# Patient Record
Sex: Female | Born: 1995 | Race: White | Hispanic: No | Marital: Single | Smoking: Never smoker
Health system: Southern US, Community
[De-identification: ages and names within clinical notes are randomized; demographics above are authoritative.]

## PROBLEM LIST (undated history)

## (undated) DIAGNOSIS — F329 Major depressive disorder, single episode, unspecified: Secondary | ICD-10-CM

## (undated) DIAGNOSIS — Z8742 Personal history of other diseases of the female genital tract: Secondary | ICD-10-CM

## (undated) DIAGNOSIS — N946 Dysmenorrhea, unspecified: Secondary | ICD-10-CM

## (undated) DIAGNOSIS — N83209 Unspecified ovarian cyst, unspecified side: Secondary | ICD-10-CM

## (undated) DIAGNOSIS — F32A Depression, unspecified: Secondary | ICD-10-CM

## (undated) HISTORY — DX: Depression, unspecified: F32.A

## (undated) HISTORY — DX: Dysmenorrhea, unspecified: N94.6

## (undated) HISTORY — DX: Unspecified ovarian cyst, unspecified side: N83.209

## (undated) HISTORY — DX: Personal history of other diseases of the female genital tract: Z87.42

## (undated) HISTORY — DX: Major depressive disorder, single episode, unspecified: F32.9

---

## 2016-10-24 DIAGNOSIS — N83209 Unspecified ovarian cyst, unspecified side: Secondary | ICD-10-CM

## 2016-10-24 HISTORY — DX: Unspecified ovarian cyst, unspecified side: N83.209

## 2016-11-02 ENCOUNTER — Ambulatory Visit (HOSPITAL_COMMUNITY)
Admission: EM | Admit: 2016-11-02 | Discharge: 2016-11-02 | Disposition: A | Discharge status: Home / Self Care | Payer: Commercial Managed Care - PPO | Attending: Family Medicine | Admitting: Family Medicine

## 2016-11-02 ENCOUNTER — Ambulatory Visit (HOSPITAL_COMMUNITY): Payer: Commercial Managed Care - PPO

## 2016-11-02 ENCOUNTER — Encounter (HOSPITAL_COMMUNITY): Payer: Self-pay | Admitting: Emergency Medicine

## 2016-11-02 DIAGNOSIS — B9789 Other viral agents as the cause of diseases classified elsewhere: Secondary | ICD-10-CM | POA: Diagnosis not present

## 2016-11-02 DIAGNOSIS — J069 Acute upper respiratory infection, unspecified: Secondary | ICD-10-CM | POA: Diagnosis not present

## 2016-11-02 MED ORDER — AZITHROMYCIN 250 MG PO TABS: ORAL_TABLET | ORAL | 0 refills | Status: DC

## 2016-11-02 MED ORDER — HYDROCOD POLST-CPM POLST ER 10-8 MG/5ML PO SUER: 5.0000 mL | Freq: Two times a day (BID) | ORAL | 1 refills | Status: DC | PRN

## 2016-11-02 MED ORDER — IPRATROPIUM BROMIDE 0.06 % NA SOLN: 2.0000 | Freq: Four times a day (QID) | NASAL | 1 refills | Status: DC

## 2016-11-02 NOTE — Discharge Instructions (Signed)
Drink plenty of fluids as discussed, use medicine as prescribed, and mucinex or delsym for cough. Return or see your doctor if further problems °

## 2016-11-02 NOTE — ED Provider Notes (Signed)
[facility]    CSN: [id number] Arrival date & time: 11/02/16  1818     History   Chief Complaint Chief Complaint  Patient presents with  . Cough    HPI Kelli Willis is a 21 y.o. female.   The history is provided by the patient.  Cough  Cough characteristics:  Non-productive Severity:  Moderate Onset quality:  Gradual Duration:  4 weeks Progression:  Unchanged Chronicity:  New Smoker: no   Context: upper respiratory infection and weather changes   Relieved by:  None tried Worsened by:  Nothing Ineffective treatments:  None tried Associated symptoms: rhinorrhea   Associated symptoms: no fever, no shortness of breath, no sore throat and no wheezing     History reviewed. No pertinent past medical history.  There are no active problems to display for this patient.   History reviewed. No pertinent surgical history.  OB History    No data available       Home Medications    Prior to Admission medications   Medication Sig Start Date End Date Taking? Authorizing Provider  benzonatate (TESSALON) 100 MG capsule Take by mouth 3 (three) times daily as needed for cough.   Yes Historical Provider, MD  sertraline (ZOLOFT) 100 MG tablet Take 100 mg by mouth daily.   Yes Historical Provider, MD    Family History History reviewed. No pertinent family history.  Social History Social History  Substance Use Topics  . Smoking status: Never Smoker  . Smokeless tobacco: Never Used  . Alcohol use Yes     Comment: Occ     Allergies   Patient has no known allergies.   Review of Systems Review of Systems  Constitutional: Negative.  Negative for fever.  HENT: Positive for congestion, postnasal drip and rhinorrhea. Negative for sore throat.   Respiratory: Positive for cough. Negative for shortness of breath, wheezing and stridor.   Cardiovascular: Negative.   All other systems reviewed and are negative.    Physical Exam Triage Vital Signs ED  Triage Vitals  Enc Vitals Group     BP 11/02/16 1911 109/80     Pulse Rate 11/02/16 1911 82     Resp 11/02/16 1911 16     Temp 11/02/16 1911 98.4 F (36.9 C)     Temp Source 11/02/16 1911 Oral     SpO2 11/02/16 1911 100 %     Weight --      Height --      Head Circumference --      Peak Flow --      Pain Score 11/02/16 1910 4     Pain Loc --      Pain Edu? --      Excl. in GC? --    No data found.   Updated Vital Signs BP 109/80 (BP Location: Right Arm)   Pulse 82   Temp 98.4 F (36.9 C) (Oral)   Resp 16   LMP  (Exact Date)   SpO2 100%   Visual Acuity Right Eye Distance:   Left Eye Distance:   Bilateral Distance:    Right Eye Near:   Left Eye Near:    Bilateral Near:     Physical Exam  Constitutional: She appears well-developed and well-nourished. No distress.  HENT:  Right Ear: External ear normal.  Left Ear: External ear normal.  Mouth/Throat: Oropharyngeal exudate present.  Eyes: Pupils are equal, round, and reactive to light.  Neck: Normal range of motion. Neck supple.  Cardiovascular: Normal rate and regular rhythm.   Pulmonary/Chest: Effort normal and breath sounds normal.  Skin: Skin is warm and dry.  Nursing note and vitals reviewed.    UC Treatments / Results  Labs (all labs ordered are listed, but only abnormal results are displayed) Labs Reviewed - No data to display  EKG  EKG Interpretation None       Radiology No results found.  Procedures Procedures (including critical care time)  Medications Ordered in UC Medications - No data to display   Initial Impression / Assessment and Plan / UC Course  I have reviewed the triage vital signs and the nursing notes.  Pertinent labs & imaging results that were available during my care of the patient were reviewed by me and considered in my medical decision making (see chart for details).  Clinical Course       Final Clinical Impressions(s) / UC Diagnoses   Final diagnoses:    None    New Prescriptions New Prescriptions   No medications on file     Linna HoffJames D Divonte Senger, MD 11/02/16 2010

## 2016-11-02 NOTE — ED Triage Notes (Signed)
[facility] a [facility]. The patient stated that she was diagnosed with strep and flu-B over christmas. The patient reported that she has been given tessalon pearls and they are not helping with the cough.

## 2016-11-04 ENCOUNTER — Telehealth (HOSPITAL_COMMUNITY): Payer: Self-pay | Admitting: Emergency Medicine

## 2016-11-04 MED ORDER — PREDNISONE 50 MG PO TABS: ORAL_TABLET | ORAL | 0 refills | Status: DC

## 2016-11-04 NOTE — Telephone Encounter (Signed)
Pt called stating not feeling any better... Wanted to know if we could call in some steroids  Per Dr. Artis FlockKindl... Ok to call in Prednisilone 50 mg 1 tab PO x2 days 1/2 PO x2 days #3 no refills  Per her request... Called into [facility] ([address])  Adv pt to f/u w/PCP if not getting any better... Pt verb understanding.

## 2017-01-25 ENCOUNTER — Emergency Department (HOSPITAL_COMMUNITY)
Admission: EM | Admit: 2017-01-25 | Discharge: 2017-01-26 | Disposition: A | Discharge status: Home / Self Care | Payer: Commercial Managed Care - PPO | Attending: Emergency Medicine | Admitting: Emergency Medicine

## 2017-01-25 ENCOUNTER — Encounter (HOSPITAL_COMMUNITY): Payer: Self-pay

## 2017-01-25 DIAGNOSIS — R1032 Left lower quadrant pain: Secondary | ICD-10-CM | POA: Diagnosis present

## 2017-01-25 DIAGNOSIS — R102 Pelvic and perineal pain: Secondary | ICD-10-CM

## 2017-01-25 DIAGNOSIS — N83201 Unspecified ovarian cyst, right side: Secondary | ICD-10-CM | POA: Diagnosis not present

## 2017-01-25 DIAGNOSIS — Z79899 Other long term (current) drug therapy: Secondary | ICD-10-CM | POA: Insufficient documentation

## 2017-01-25 DIAGNOSIS — N83202 Unspecified ovarian cyst, left side: Secondary | ICD-10-CM | POA: Diagnosis not present

## 2017-01-25 DIAGNOSIS — N83209 Unspecified ovarian cyst, unspecified side: Secondary | ICD-10-CM

## 2017-01-25 LAB — COMPREHENSIVE METABOLIC PANEL
ALT: 11 U/L — ABNORMAL LOW (ref 14–54)
ANION GAP: 7 (ref 5–15)
AST: 16 U/L (ref 15–41)
Albumin: 4.5 g/dL (ref 3.5–5.0)
Alkaline Phosphatase: 56 U/L (ref 38–126)
BILIRUBIN TOTAL: 0.5 mg/dL (ref 0.3–1.2)
BUN: 16 mg/dL (ref 6–20)
CO2: 28 mmol/L (ref 22–32)
Calcium: 9.1 mg/dL (ref 8.9–10.3)
Chloride: 102 mmol/L (ref 101–111)
Creatinine, Ser: 0.84 mg/dL (ref 0.44–1.00)
GFR calc Af Amer: >60 mL/min (ref >60)
Glucose, Bld: 85 mg/dL (ref 65–99)
POTASSIUM: 3.5 mmol/L (ref 3.5–5.1)
Sodium: 137 mmol/L (ref 135–145)
TOTAL PROTEIN: 7.3 g/dL (ref 6.5–8.1)

## 2017-01-25 LAB — URINALYSIS, ROUTINE W REFLEX MICROSCOPIC
BACTERIA UA: NONE SEEN
BILIRUBIN URINE: NEGATIVE
Glucose, UA: NEGATIVE mg/dL
HGB URINE DIPSTICK: NEGATIVE
Ketones, ur: NEGATIVE mg/dL
NITRITE: NEGATIVE
Protein, ur: NEGATIVE mg/dL
SPECIFIC GRAVITY, URINE: 1.021 (ref 1.005–1.030)
pH: 5 (ref 5.0–8.0)

## 2017-01-25 LAB — LIPASE, BLOOD: Lipase: 19 U/L (ref 11–51)

## 2017-01-25 LAB — CBC
HEMATOCRIT: 32.8 % — AB (ref 36.0–46.0)
HEMOGLOBIN: 10.8 g/dL — AB (ref 12.0–15.0)
MCH: 30.6 pg (ref 26.0–34.0)
MCHC: 32.9 g/dL (ref 30.0–36.0)
MCV: 92.9 fL (ref 78.0–100.0)
Platelets: 159 10*3/uL (ref 150–400)
RBC: 3.53 MIL/uL — ABNORMAL LOW (ref 3.87–5.11)
RDW: 12.9 % (ref 11.5–15.5)
WBC: 8 10*3/uL (ref 4.0–10.5)

## 2017-01-25 LAB — I-STAT BETA HCG BLOOD, ED (MC, WL, AP ONLY)

## 2017-01-25 MED ORDER — MORPHINE SULFATE (PF) 2 MG/ML IV SOLN
2.0000 mg | Freq: Once | INTRAVENOUS | Status: AC
  Administered 2017-01-26: 2 mg via INTRAVENOUS
  Filled 2017-01-25: qty 1

## 2017-01-25 MED ORDER — SODIUM CHLORIDE 0.9 % IV BOLUS (SEPSIS)
1000.0000 mL | Freq: Once | INTRAVENOUS | Status: AC
  Administered 2017-01-26: 1000 mL via INTRAVENOUS

## 2017-01-25 NOTE — ED Triage Notes (Signed)
PT C/O LLQ ABDOMINAL PAIN WITH BLOATING SINCE Monday. PT DENIES N/V/D OR FEVER. SHE STS SHE HAS TRIED OTC LAXATIVES AND ENEMAS W/O SUCCESS. PT'S LAST NORMAL BM WAS Sunday.

## 2017-01-25 NOTE — ED Provider Notes (Signed)
[facility] Note   CSN: [id number] Arrival date & time: 01/25/17  1955   By signing my name below, I, Kelli Willis, attest that this documentation has been prepared under the direction and in the presence of [employer], PA-C. Electronically Signed: Freida Willis, Scribe. 01/25/2017. 11:39 PM.  History   Chief Complaint Chief Complaint  Patient presents with  . Abdominal Pain    LLQ    The history is provided by the patient. No language interpreter was used.    HPI Comments:  Kelli Willis is a 21 y.o. female who presents to the Emergency Department complaining of gradually worsening, abdominal pain x 3 days with associated abdominal bloating. She believes she is constipated. She has a h/o constipation as a child. She has had 2 bottles of magnesium citrate, eaten fiber bars, used 2 enemas and suppositories with little relief. No fever, chills, vaginal discharge or dysuria. No h/o SBO or abdominal surgeries. No recent antibiotic use or recent travel outside the country. Pt has an Nexplanon in place and does not have regular periods.  She admits to occasional ETOH use and states she tried cocaine today to see if it would help. She is sexually active with 1 female partner and is concerned for STDs.  History reviewed. No pertinent past medical history.  There are no active problems to display for this patient.   History reviewed. No pertinent surgical history.  OB History    No data available       Home Medications    Prior to Admission medications   Medication Sig Start Date End Date Taking? Authorizing Provider  magnesium citrate SOLN Take 1 Bottle by mouth once.   Yes Historical Provider, MD  sertraline (ZOLOFT) 100 MG tablet Take 100 mg by mouth daily.   Yes Historical Provider, MD  benzonatate (TESSALON) 100 MG capsule Take by mouth 3 (three) times daily as needed for cough.    Historical Provider, MD    Family History History reviewed. No  pertinent family history.  Social History Social History  Substance Use Topics  . Smoking status: Never Smoker  . Smokeless tobacco: Never Used  . Alcohol use Yes     Comment: Occ     Allergies   Patient has no known allergies.   Review of Systems Review of Systems  Constitutional: Negative for chills and fever.  Gastrointestinal: Positive for abdominal distention, abdominal pain and constipation.  Genitourinary: Negative for dysuria and vaginal discharge.  All other systems reviewed and are negative.    Physical Exam Updated Vital Signs BP 106/61 (BP Location: Left Arm)   Pulse 88   Temp 98.3 F (36.8 C) (Oral)   Resp 18   Ht  (1.549 m)   Wt 124 lb (56.2 kg)   SpO2 100%   BMI 23.43 kg/m   Physical Exam  Constitutional: She appears well-developed and well-nourished. No distress.  Awake, alert, nontoxic appearance  HENT:  Head: Normocephalic and atraumatic.  Mouth/Throat: Oropharynx is clear and moist. No oropharyngeal exudate.  Eyes: Conjunctivae are normal. No scleral icterus.  Neck: Normal range of motion. Neck supple.  Cardiovascular: Normal rate, regular rhythm, normal heart sounds and intact distal pulses.   No murmur heard. Pulmonary/Chest: Effort normal and breath sounds normal. No respiratory distress. She has no wheezes.  Equal chest expansion  Abdominal: Soft. Bowel sounds are normal. She exhibits distension. She exhibits no mass. There is tenderness in the right lower quadrant, suprapubic area and left  lower quadrant. There is guarding. There is no rigidity, no rebound and no CVA tenderness. Hernia confirmed negative in the right inguinal area and confirmed negative in the left inguinal area.  Genitourinary: Uterus normal. No labial fusion. There is no rash, tenderness or lesion on the right labia. There is no rash, tenderness or lesion on the left labia. Uterus is not deviated, not enlarged, not fixed and not tender. Cervix exhibits no motion  tenderness, no discharge and no friability. Right adnexum displays no mass, no tenderness and no fullness. Left adnexum displays no mass, no tenderness and no fullness. No erythema, tenderness or bleeding in the vagina. No foreign body in the vagina. No signs of injury around the vagina. No vaginal discharge found.  Genitourinary Comments: Chaperone was present for exam which was performed with no discomfort or complications.   Musculoskeletal: Normal range of motion. She exhibits no edema.  Lymphadenopathy:       Right: No inguinal adenopathy present.       Left: No inguinal adenopathy present.  Neurological: She is alert.  Speech is clear and goal oriented Moves extremities without ataxia  Skin: Skin is warm and dry. She is not diaphoretic. No erythema.  Psychiatric: She has a normal mood and affect.  Nursing note and vitals reviewed.   ED Treatments / Results  DIAGNOSTIC STUDIES:  Oxygen Saturation is 100% on RA, normal by my interpretation.    COORDINATION OF CARE:  11:39 PM Discussed treatment plan with pt at bedside and pt agreed to plan.  Labs (all labs ordered are listed, but only abnormal results are displayed) Labs Reviewed  WET PREP, GENITAL - Abnormal; Notable for the following:       Result Value   WBC, Wet Prep HPF POC FEW (*)    All other components within normal limits  COMPREHENSIVE METABOLIC PANEL - Abnormal; Notable for the following:    ALT 11 (*)    All other components within normal limits  CBC - Abnormal; Notable for the following:    RBC 3.53 (*)    Hemoglobin 10.8 (*)    HCT 32.8 (*)    All other components within normal limits  URINALYSIS, ROUTINE W REFLEX MICROSCOPIC - Abnormal; Notable for the following:    APPearance HAZY (*)    Leukocytes, UA TRACE (*)    Squamous Epithelial / LPF 6-30 (*)    All other components within normal limits  URINE CULTURE  LIPASE, BLOOD  I-STAT BETA HCG BLOOD, ED ([facility], [facility], [facility] ONLY)  GC/CHLAMYDIA PROBE AMP ([facility]) NOT AT [facility]     Radiology US Transvaginal Non-ob  Result Date: 01/26/2017 CLINICAL DATA:  21 y/o F; pelvic pain since Monday. Question of ruptured ovarian cyst on CT. EXAM: TRANSABDOMINAL AND TRANSVAGINAL ULTRASOUND OF PELVIS DOPPLER ULTRASOUND OF OVARIES TECHNIQUE: Both transabdominal and transvaginal ultrasound examinations of the pelvis were performed. Transabdominal technique was performed for global imaging of the pelvis including uterus, ovaries, adnexal regions, and pelvic cul-de-sac. It was necessary to proceed with endovaginal exam following the transabdominal exam to visualize the adnexa. Color and duplex Doppler ultrasound was utilized to evaluate blood flow to the ovaries. COMPARISON:  01/26/2017 CT of abdomen and pelvis. FINDINGS: Uterus Measurements: 8.0 x 3.9 x 4.7 cm. No fibroids or other mass visualized. Endometrium Thickness: 8 mm.  No focal abnormality visualized. Right ovary Measurements: 2.8 x 2.3 x 2.8 cm. Complex avascular cyst with internal septations measuring up to 9 mm. Left ovary Measurements: 4.5 x  4.0 x 3.9 cm. Avascular cyst with internal septations measuring up to 2.8 cm. Pulsed Doppler evaluation of both ovaries demonstrates normal low-resistance arterial and venous waveforms. Other findings Moderate volume of complex fluid in the pelvis with increased attenuation on CT likely representing hemorrhage. IMPRESSION: 1. Bilateral avascular complex cysts in the adnexa. The left cyst is slightly decreased in size in comparison with the prior CT given differences in technique and possibly is undergoing rupture. Follow-up ultrasound in 6-12 weeks is recommended to ensure resolution. 2. Moderate volume of complex fluid in the pelvis with increased attenuation on CT likely representing hemorrhage. Electronically Signed   By: Mitzi Hansen M.D.   On: 01/26/2017 05:58   US Pelvis Complete  Result Date: 01/26/2017 CLINICAL DATA:  21 y/o F; pelvic pain since Monday.  Question of ruptured ovarian cyst on CT. EXAM: TRANSABDOMINAL AND TRANSVAGINAL ULTRASOUND OF PELVIS DOPPLER ULTRASOUND OF OVARIES TECHNIQUE: Both transabdominal and transvaginal ultrasound examinations of the pelvis were performed. Transabdominal technique was performed for global imaging of the pelvis including uterus, ovaries, adnexal regions, and pelvic cul-de-sac. It was necessary to proceed with endovaginal exam following the transabdominal exam to visualize the adnexa. Color and duplex Doppler ultrasound was utilized to evaluate blood flow to the ovaries. COMPARISON:  01/26/2017 CT of abdomen and pelvis. FINDINGS: Uterus Measurements: 8.0 x 3.9 x 4.7 cm. No fibroids or other mass visualized. Endometrium Thickness: 8 mm.  No focal abnormality visualized. Right ovary Measurements: 2.8 x 2.3 x 2.8 cm. Complex avascular cyst with internal septations measuring up to 9 mm. Left ovary Measurements: 4.5 x 4.0 x 3.9 cm. Avascular cyst with internal septations measuring up to 2.8 cm. Pulsed Doppler evaluation of both ovaries demonstrates normal low-resistance arterial and venous waveforms. Other findings Moderate volume of complex fluid in the pelvis with increased attenuation on CT likely representing hemorrhage. IMPRESSION: 1. Bilateral avascular complex cysts in the adnexa. The left cyst is slightly decreased in size in comparison with the prior CT given differences in technique and possibly is undergoing rupture. Follow-up ultrasound in 6-12 weeks is recommended to ensure resolution. 2. Moderate volume of complex fluid in the pelvis with increased attenuation on CT likely representing hemorrhage. Electronically Signed   By: Mitzi Hansen M.D.   On: 01/26/2017 05:58   Ct Abdomen Pelvis W Contrast  Result Date: 01/26/2017 CLINICAL DATA:  21 y/o F; increasing abdominal pain for 3 days with feeling of constipation. EXAM: CT ABDOMEN AND PELVIS WITH CONTRAST TECHNIQUE: Multidetector CT imaging of the abdomen  and pelvis was performed using the standard protocol following bolus administration of intravenous contrast. CONTRAST:  100 cc Isovue-300 COMPARISON:  None. FINDINGS: Lower chest: No acute abnormality. Hepatobiliary: No focal liver abnormality is seen. No gallstones, gallbladder wall thickening, or biliary dilatation. Pancreas: Unremarkable. No pancreatic ductal dilatation or surrounding inflammatory changes. Spleen: Normal in size without focal abnormality. Adrenals/Urinary Tract: Adrenal glands are unremarkable. Kidneys are normal, without renal calculi, focal lesion, or hydronephrosis. Bladder is unremarkable. Stomach/Bowel: Stomach is within normal limits. Appendix appears normal. No evidence of bowel wall thickening, distention, or inflammatory changes. Vascular/Lymphatic: No significant vascular findings are present. No enlarged abdominal or pelvic lymph nodes. Reproductive: Bilateral complex ovarian cysts measuring up to 32 mm on the left. Normal uterus. Other: Small volume of high attenuation fluid within the pelvis probably representing pneumoperitoneum. Musculoskeletal: No acute or significant osseous findings. IMPRESSION: 1. Small volume of high attenuation fluid within the pelvis probably representing pneumoperitoneum. 2. Bilateral complex ovarian cysts, the  combination of findings probably represents a ruptured hemorrhagic cyst. Consider pelvic ultrasound for further characterization. 3. Otherwise unremarkable CT of the abdomen and pelvis. These results were called by telephone at the time of interpretation on 01/26/2017 at 2:59 am to Dr. Dierdre Forth , who verbally acknowledged these results. Electronically Signed   By: Mitzi Hansen M.D.   On: 01/26/2017 03:00   [place] Art/ven Flow Abd Pelv Doppler  Result Date: 01/26/2017 CLINICAL DATA:  21 y/o F; pelvic pain since Monday. Question of ruptured ovarian cyst on CT. EXAM: TRANSABDOMINAL AND TRANSVAGINAL ULTRASOUND OF PELVIS DOPPLER  ULTRASOUND OF OVARIES TECHNIQUE: Both transabdominal and transvaginal ultrasound examinations of the pelvis were performed. Transabdominal technique was performed for global imaging of the pelvis including uterus, ovaries, adnexal regions, and pelvic cul-de-sac. It was necessary to proceed with endovaginal exam following the transabdominal exam to visualize the adnexa. Color and duplex Doppler ultrasound was utilized to evaluate blood flow to the ovaries. COMPARISON:  01/26/2017 CT of abdomen and pelvis. FINDINGS: Uterus Measurements: 8.0 x 3.9 x 4.7 cm. No fibroids or other mass visualized. Endometrium Thickness: 8 mm.  No focal abnormality visualized. Right ovary Measurements: 2.8 x 2.3 x 2.8 cm. Complex avascular cyst with internal septations measuring up to 9 mm. Left ovary Measurements: 4.5 x 4.0 x 3.9 cm. Avascular cyst with internal septations measuring up to 2.8 cm. Pulsed Doppler evaluation of both ovaries demonstrates normal low-resistance arterial and venous waveforms. Other findings Moderate volume of complex fluid in the pelvis with increased attenuation on CT likely representing hemorrhage. IMPRESSION: 1. Bilateral avascular complex cysts in the adnexa. The left cyst is slightly decreased in size in comparison with the prior CT given differences in technique and possibly is undergoing rupture. Follow-up ultrasound in 6-12 weeks is recommended to ensure resolution. 2. Moderate volume of complex fluid in the pelvis with increased attenuation on CT likely representing hemorrhage. Electronically Signed   By: Mitzi Hansen M.D.   On: 01/26/2017 05:58    Procedures Procedures (including critical care time)  Medications Ordered in ED Medications  sodium chloride 0.9 % bolus 1,000 mL (0 mLs Intravenous Stopped 01/26/17 0154)  morphine 2 MG/ML injection 2 mg (2 mg Intravenous Given 01/26/17 0004)  morphine 2 MG/ML injection 2 mg (2 mg Intravenous Given 01/26/17 0108)  iopamidol (ISOVUE-300) 61 %  injection 100 mL (100 mLs Intravenous Contrast Given 01/26/17 0129)  oxyCODONE-acetaminophen (PERCOCET/ROXICET) 5-325 MG per tablet 2 tablet (2 tablets Oral Given 01/26/17 0414)     Initial Impression / Assessment and Plan / ED Course  I have reviewed the triage vital signs and the nursing notes.  Pertinent labs & imaging results that were available during my care of the patient were reviewed by me and considered in my medical decision making (see chart for details).      She presents with abdominal pain. On initial exam she states she is sure she is constipated and is simply here for help with this. She however has abdominal distention, lower abdominal tenderness and guarding. CT scan shows small volume of high attenuation fluid within the pelvis with bilateral complex ovarian cysts suggesting a ruptured hemorrhagic cyst.  Ultrasound was obtained showing bilateral avascular complex cysts in the adnexa with the left being slightly decreased in size in comparison to the prior CT. Additionally patient is a moderate volume of complex fluid in the pelvis likely represent hemorrhage.  Patient has had difficult control pain here in the emergency department. She is not vomiting. Her  vital signs within within normal limits and stable throughout her time here. She is not anemic. She is without tachycardia or hypotension.  She will need to follow-up with OB/GYN.  The patient was discussed with and seen by Dr. Read Drivers who agrees with the treatment plan.   Final Clinical Impressions(s) / ED Diagnoses   Final diagnoses:  Pelvic pain  Cysts of both ovaries  Hemorrhagic ovarian cyst    New Prescriptions Current Discharge Medication List     I personally performed the services described in this documentation, which was scribed in my presence. The recorded information has been reviewed and is accurate.     Dahlia Client Jeani Fassnacht, PA-C 01/26/17 0631    Paula Libra, MD 01/26/17 [id number]

## 2017-01-26 ENCOUNTER — Emergency Department (HOSPITAL_COMMUNITY): Payer: Commercial Managed Care - PPO

## 2017-01-26 ENCOUNTER — Encounter (HOSPITAL_COMMUNITY): Payer: Self-pay

## 2017-01-26 LAB — WET PREP, GENITAL
Clue Cells Wet Prep HPF POC: NONE SEEN
SPERM: NONE SEEN
TRICH WET PREP: NONE SEEN
YEAST WET PREP: NONE SEEN

## 2017-01-26 LAB — GC/CHLAMYDIA PROBE AMP (~~LOC~~) NOT AT ARMC
Chlamydia: POSITIVE — AB
Neisseria Gonorrhea: NEGATIVE

## 2017-01-26 MED ORDER — ONDANSETRON 8 MG PO TBDP: ORAL_TABLET | ORAL | 0 refills | Status: DC

## 2017-01-26 MED ORDER — OXYCODONE-ACETAMINOPHEN 5-325 MG PO TABS
2.0000 | ORAL_TABLET | Freq: Once | ORAL | Status: AC
  Administered 2017-01-26: 2 via ORAL
  Filled 2017-01-26: qty 2

## 2017-01-26 MED ORDER — IOPAMIDOL (ISOVUE-300) INJECTION 61%
100.0000 mL | Freq: Once | INTRAVENOUS | Status: AC | PRN
  Administered 2017-01-26: 100 mL via INTRAVENOUS

## 2017-01-26 MED ORDER — IOPAMIDOL (ISOVUE-300) INJECTION 61%
INTRAVENOUS | Status: AC
  Filled 2017-01-26: qty 100

## 2017-01-26 MED ORDER — HYDROCODONE-ACETAMINOPHEN 5-325 MG PO TABS: 1.0000 | ORAL_TABLET | ORAL | 0 refills | Status: DC | PRN

## 2017-01-26 MED ORDER — MORPHINE SULFATE (PF) 2 MG/ML IV SOLN
2.0000 mg | Freq: Once | INTRAVENOUS | Status: AC
  Administered 2017-01-26: 2 mg via INTRAVENOUS
  Filled 2017-01-26: qty 1

## 2017-01-26 NOTE — ED Notes (Signed)
US arrived at bedside.

## 2017-01-26 NOTE — ED Notes (Signed)
PA made aware of pain medication request.

## 2017-01-26 NOTE — Discharge Instructions (Signed)
1. Medications: zofran, vicodin, usual home medications 2. Treatment: rest, drink plenty of fluids, advance diet slowly 3. Follow Up: Please followup with your primary doctor in 2 days for discussion of your diagnoses and further evaluation after today's visit; if you do not have a primary care doctor use the resource guide provided to find one; Please return to the ER for persistent vomiting, high fevers or worsening symptoms  

## 2017-01-26 NOTE — ED Notes (Signed)
Pt informed of [place] delay.

## 2017-01-27 LAB — URINE CULTURE: Culture: NO GROWTH

## 2017-02-02 ENCOUNTER — Encounter: Payer: Self-pay | Admitting: Obstetrics & Gynecology

## 2017-02-02 ENCOUNTER — Ambulatory Visit (INDEPENDENT_AMBULATORY_CARE_PROVIDER_SITE_OTHER): Payer: Commercial Managed Care - PPO | Admitting: Obstetrics & Gynecology

## 2017-02-02 VITALS — BP 113/62 | HR 63 | Ht 61.0 in | Wt 120.8 lb

## 2017-02-02 DIAGNOSIS — N83209 Unspecified ovarian cyst, unspecified side: Secondary | ICD-10-CM | POA: Diagnosis not present

## 2017-02-02 DIAGNOSIS — Z0000 Encounter for general adult medical examination without abnormal findings: Secondary | ICD-10-CM

## 2017-02-02 NOTE — Addendum Note (Signed)
Addended by: Sherre Lain A on: 02/02/2017 08:24 AM   Modules accepted: Orders

## 2017-02-02 NOTE — Progress Notes (Signed)
   Subjective:    Patient ID: Kelli Willis, female    DOB: Dec 03, 1995, 20 y.o.   MRN: [id number]  HPI  21 yo SW G0 here today for f/u after [facility] ER visit last week for ruptured ovarian cyst. Her pain is much better.  Review of Systems She had the 3 Gardasil series. + CT on 01-25-17 That boyfriend is no longer part of her life.     Objective:   Physical Exam Pleasant [id number] Breathing, conversing normally Abd- benign Vulva, vagina, cervix normal NSSA, NT, no adnexal masses or tenderness REC CONDOMS EVERY IC       Assessment & Plan:  Rec f/u u/s in 8 weeks. 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

## 2017-02-02 NOTE — Progress Notes (Signed)
Patient states of being seen last Wednesday 01/25/17 6:00pm at [facility] for ruptured ovarian cyst. Having minimal pain at a scale of 3 today.

## 2017-02-03 LAB — HIV ANTIBODY (ROUTINE TESTING W REFLEX): HIV SCREEN 4TH GENERATION: NONREACTIVE

## 2017-02-03 LAB — HEPATITIS C ANTIBODY: Hep C Virus Ab: <0.1 s/co ratio (ref 0.0–0.9)

## 2017-02-03 LAB — HEPATITIS B SURFACE ANTIGEN: HEP B S AG: NEGATIVE

## 2017-02-03 LAB — RPR: RPR: NONREACTIVE

## 2017-02-07 LAB — CYTOLOGY - PAP: Diagnosis: NEGATIVE

## 2017-03-02 ENCOUNTER — Ambulatory Visit (INDEPENDENT_AMBULATORY_CARE_PROVIDER_SITE_OTHER): Payer: Commercial Managed Care - PPO | Admitting: *Deleted

## 2017-03-02 DIAGNOSIS — Z202 Contact with and (suspected) exposure to infections with a predominantly sexual mode of transmission: Secondary | ICD-10-CM | POA: Diagnosis not present

## 2017-03-02 DIAGNOSIS — Z113 Encounter for screening for infections with a predominantly sexual mode of transmission: Secondary | ICD-10-CM

## 2017-03-02 NOTE — Progress Notes (Signed)
Pt here for TOC of previous +Chlamydia as instructed by Dr. Marice Potterove.  Self swab obtained.

## 2017-03-03 LAB — CERVICOVAGINAL ANCILLARY ONLY
Chlamydia: NEGATIVE
Neisseria Gonorrhea: NEGATIVE

## 2017-04-20 ENCOUNTER — Ambulatory Visit (HOSPITAL_COMMUNITY): Payer: Commercial Managed Care - PPO

## 2017-09-15 ENCOUNTER — Encounter (HOSPITAL_COMMUNITY): Payer: Self-pay | Admitting: Emergency Medicine

## 2017-09-15 ENCOUNTER — Other Ambulatory Visit: Payer: Self-pay

## 2017-09-15 DIAGNOSIS — Z79899 Other long term (current) drug therapy: Secondary | ICD-10-CM | POA: Diagnosis not present

## 2017-09-15 DIAGNOSIS — N83209 Unspecified ovarian cyst, unspecified side: Secondary | ICD-10-CM | POA: Diagnosis not present

## 2017-09-15 DIAGNOSIS — R1031 Right lower quadrant pain: Secondary | ICD-10-CM | POA: Diagnosis present

## 2017-09-15 LAB — URINALYSIS, ROUTINE W REFLEX MICROSCOPIC
BILIRUBIN URINE: NEGATIVE
Glucose, UA: NEGATIVE mg/dL
HGB URINE DIPSTICK: NEGATIVE
KETONES UR: NEGATIVE mg/dL
Leukocytes, UA: NEGATIVE
NITRITE: NEGATIVE
PH: 6 (ref 5.0–8.0)
Protein, ur: NEGATIVE mg/dL
SPECIFIC GRAVITY, URINE: 1.019 (ref 1.005–1.030)

## 2017-09-15 LAB — CBC
HCT: 38.2 % (ref 36.0–46.0)
HEMOGLOBIN: 12.8 g/dL (ref 12.0–15.0)
MCH: 30 pg (ref 26.0–34.0)
MCHC: 33.5 g/dL (ref 30.0–36.0)
MCV: 89.5 fL (ref 78.0–100.0)
Platelets: 173 10*3/uL (ref 150–400)
RBC: 4.27 MIL/uL (ref 3.87–5.11)
RDW: 12.9 % (ref 11.5–15.5)
WBC: 10.7 10*3/uL — ABNORMAL HIGH (ref 4.0–10.5)

## 2017-09-15 LAB — COMPREHENSIVE METABOLIC PANEL
ALBUMIN: 4.3 g/dL (ref 3.5–5.0)
ALK PHOS: 58 U/L (ref 38–126)
ALT: 11 U/L — AB (ref 14–54)
AST: 16 U/L (ref 15–41)
Anion gap: 10 (ref 5–15)
BILIRUBIN TOTAL: 0.5 mg/dL (ref 0.3–1.2)
BUN: 14 mg/dL (ref 6–20)
CALCIUM: 9.3 mg/dL (ref 8.9–10.3)
CO2: 24 mmol/L (ref 22–32)
Chloride: 103 mmol/L (ref 101–111)
Creatinine, Ser: 0.72 mg/dL (ref 0.44–1.00)
GFR calc Af Amer: >60 mL/min (ref >60)
GFR calc non Af Amer: >60 mL/min (ref >60)
GLUCOSE: 114 mg/dL — AB (ref 65–99)
Potassium: 3.7 mmol/L (ref 3.5–5.1)
SODIUM: 137 mmol/L (ref 135–145)
TOTAL PROTEIN: 6.7 g/dL (ref 6.5–8.1)

## 2017-09-15 LAB — I-STAT BETA HCG BLOOD, ED (MC, WL, AP ONLY)

## 2017-09-15 IMAGING — CT CT ABD-PELV W/ CM
2 of 4 series · 15 of 46 positions shown, 17 images · IV contrast (agent unspecified)
Comparison: None.

CLINICAL DATA: 20 y/o F; increasing abdominal pain for 3 days with
feeling of constipation.

EXAM:
CT ABDOMEN AND PELVIS WITH CONTRAST
TECHNIQUE: Multidetector CT imaging of the abdomen and pelvis was performed
using the standard protocol following bolus administration of
intravenous contrast.
CONTRAST:  100 cc Nsovue-LKK

[Series 2: abd/pel with · axial · 0.65mm/px · z∈[-243,+147]mm · 12 of 88 slices shown, 14 images]
[im 5/88  soft-tissue]
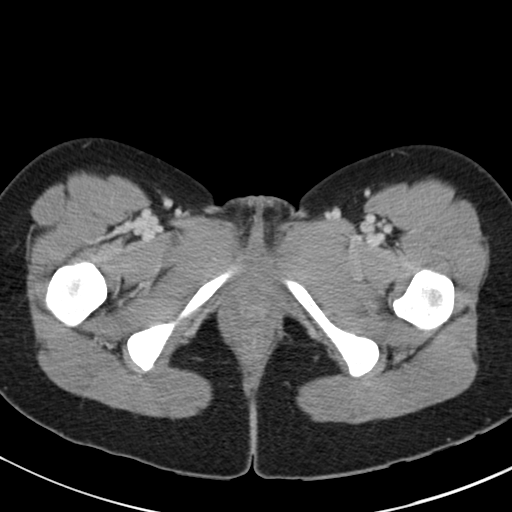
[im 5/88  bone]
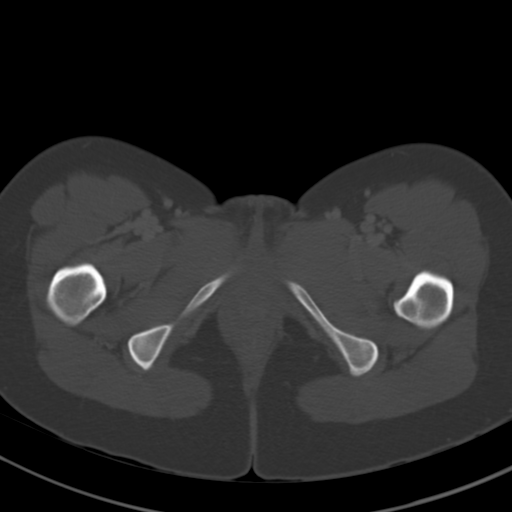
[im 14/88  soft-tissue]
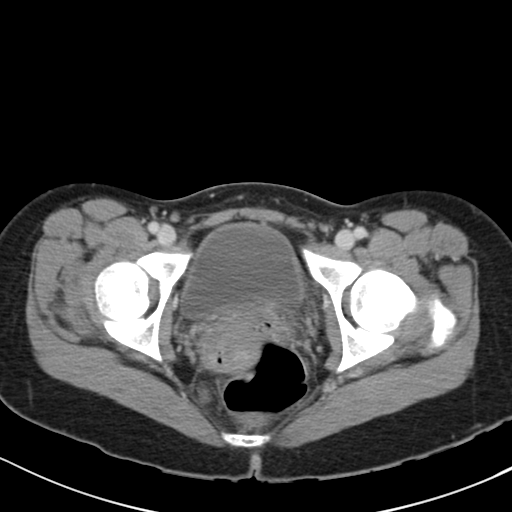
[im 18/88  soft-tissue]
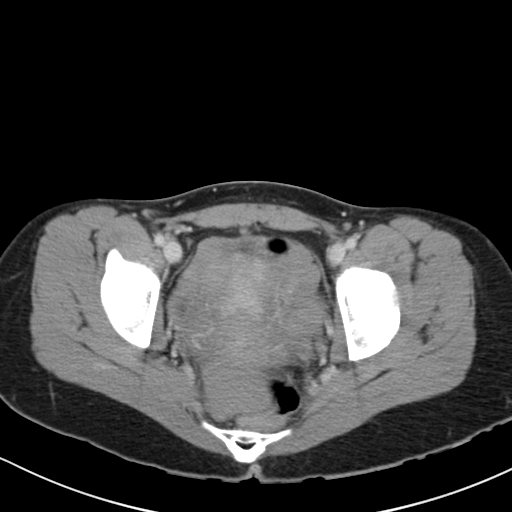
[im 27/88  soft-tissue]
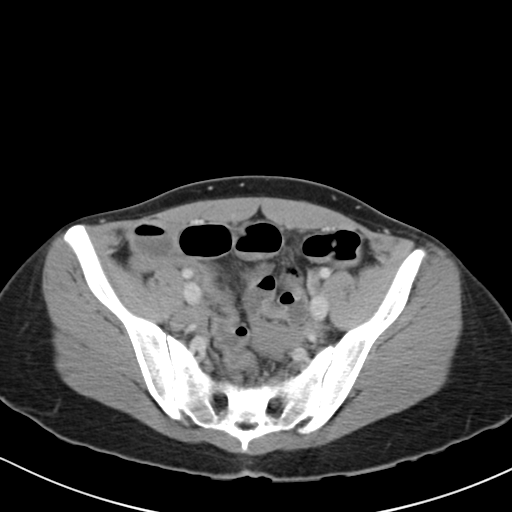
[im 35/88  soft-tissue]
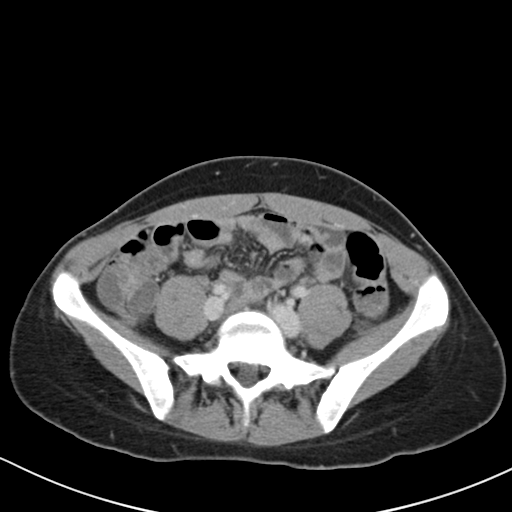
[im 40/88  soft-tissue]
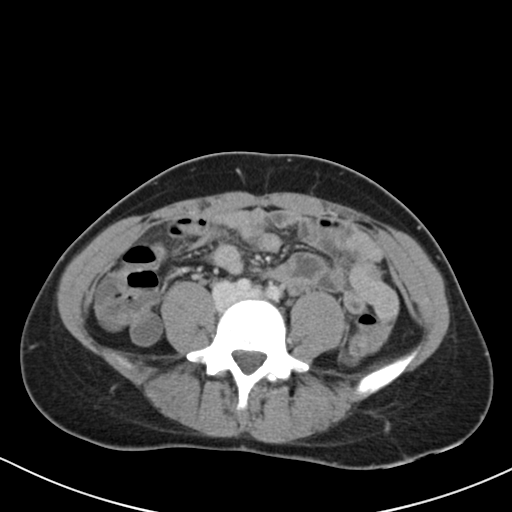
[im 48/88  soft-tissue]
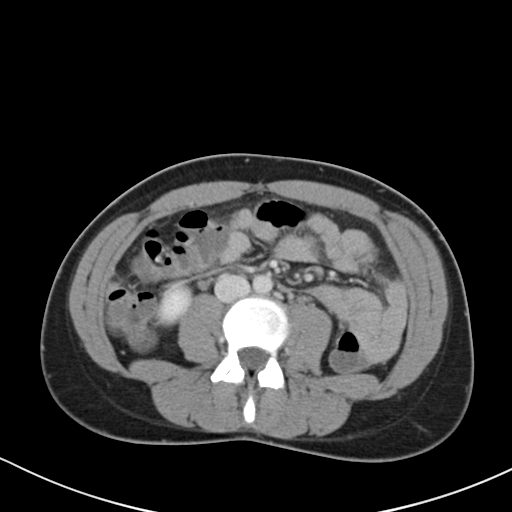
[im 53/88  soft-tissue]
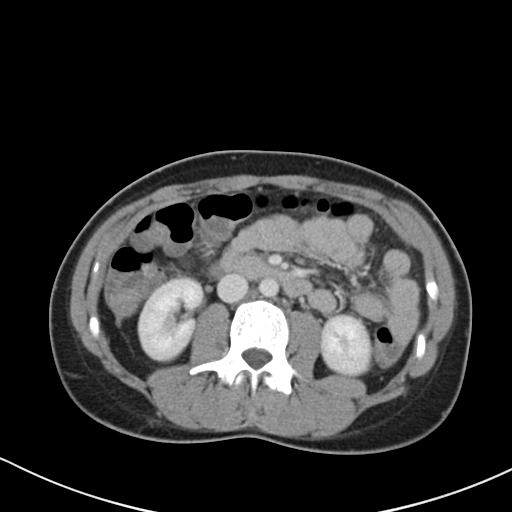
[im 61/88  soft-tissue]
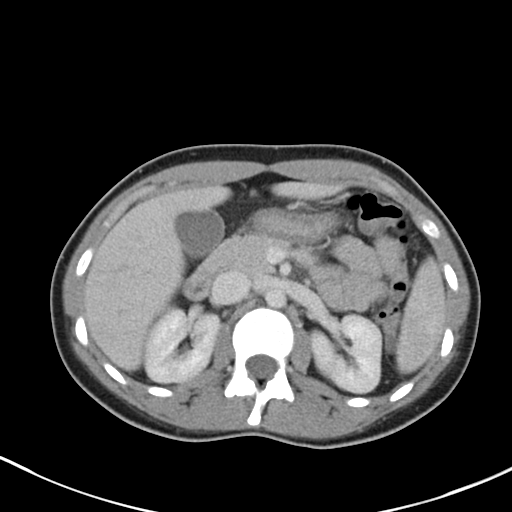
[im 61/88  bone]
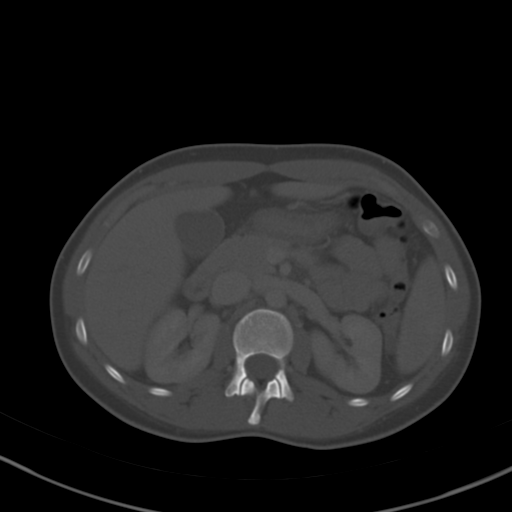
[im 70/88  soft-tissue]
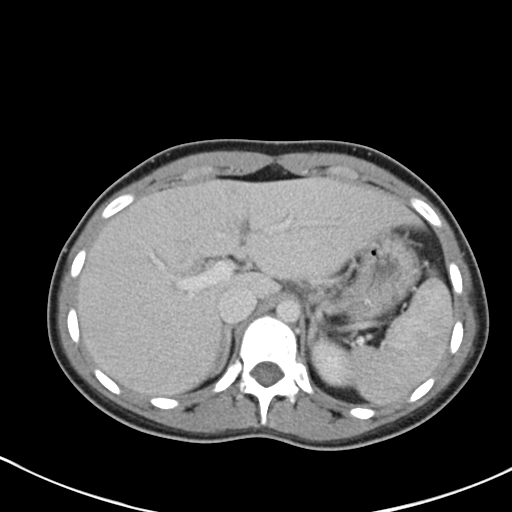
[im 74/88  soft-tissue]
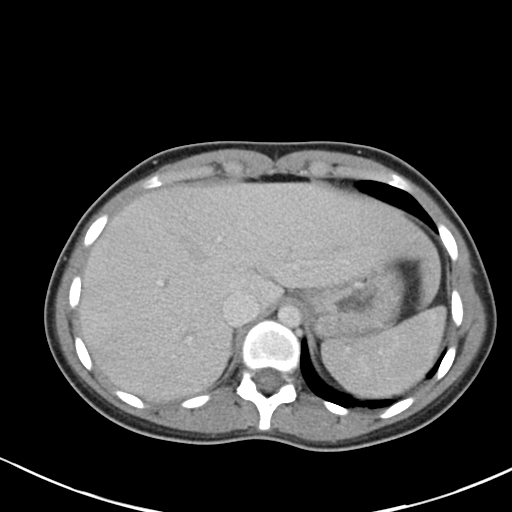
[im 83/88  soft-tissue]
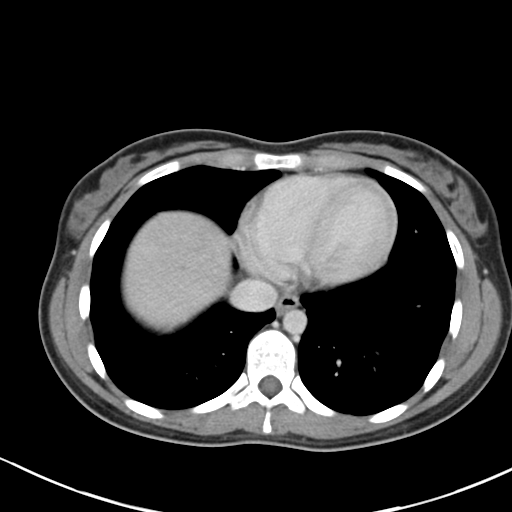

[Series 4: coronal a/|p · coronal · 0.63mm/px · 3 of 105 slices shown]
[im 35/105  soft-tissue]
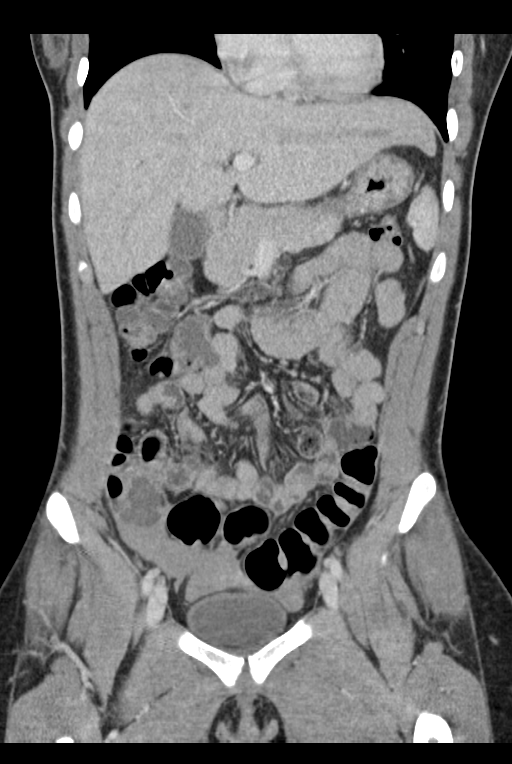
[im 47/105  soft-tissue]
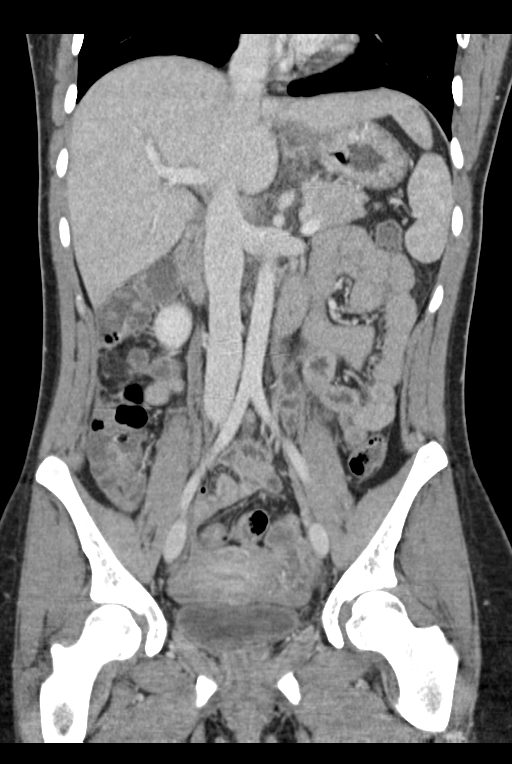
[im 58/105  soft-tissue]
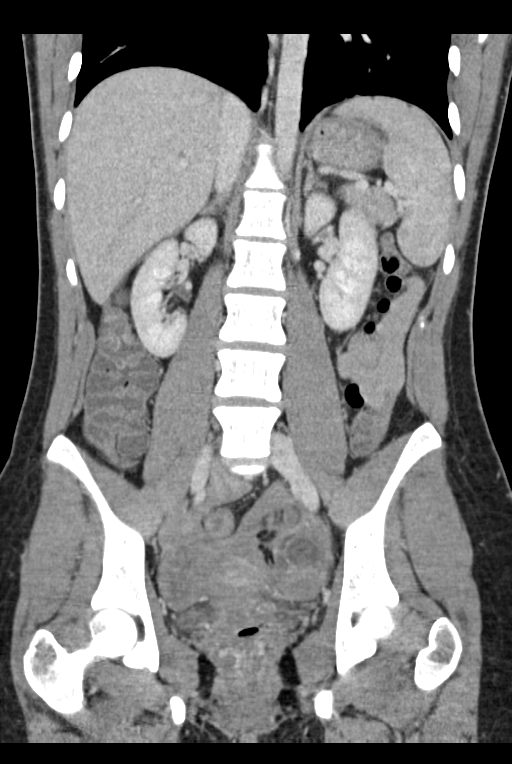

[15 of 46 positions shown; findings below may reference images not displayed]

FINDINGS: Lower chest: No acute abnormality.

Hepatobiliary: No focal liver abnormality is seen. No gallstones,
gallbladder wall thickening, or biliary dilatation.

Pancreas: Unremarkable. No pancreatic ductal dilatation or
surrounding inflammatory changes.

Spleen: Normal in size without focal abnormality.

Adrenals/Urinary Tract: Adrenal glands are unremarkable. Kidneys are
normal, without renal calculi, focal lesion, or hydronephrosis.
Bladder is unremarkable.

Stomach/Bowel: Stomach is within normal limits. Appendix appears
normal. No evidence of bowel wall thickening, distention, or
inflammatory changes.

Vascular/Lymphatic: No significant vascular findings are present. No
enlarged abdominal or pelvic lymph nodes.

Reproductive: Bilateral complex ovarian cysts measuring up to 32 mm
on the left. Normal uterus.

Other: Small volume of high attenuation fluid within the pelvis
probably representing pneumoperitoneum.

Musculoskeletal: No acute or significant osseous findings.
IMPRESSION: 1. Small volume of high attenuation fluid within the pelvis probably
representing pneumoperitoneum.
2. Bilateral complex ovarian cysts, the combination of findings
probably represents a ruptured hemorrhagic cyst. Consider pelvic
ultrasound for further characterization.
3. Otherwise unremarkable CT of the abdomen and pelvis.
These results were called by telephone at the time of interpretation
on 01/26/2017 at [DATE] to Dr. LUXON DIA , who verbally
acknowledged these results.

By: Rashaan Negrin M.D.

## 2017-09-15 MED ORDER — FENTANYL CITRATE (PF) 100 MCG/2ML IJ SOLN
50.0000 ug | INTRAMUSCULAR | Status: DC | PRN
  Administered 2017-09-15: 50 ug via NASAL
  Filled 2017-09-15: qty 2

## 2017-09-15 NOTE — ED Triage Notes (Signed)
Patient arrives with complaint of RLQ abdominal pain, onset 2 days. States history of the same last year and was found to have a ruptured ovarian cyst. Denies bleeding, vaginal discharge, fever.

## 2017-09-16 ENCOUNTER — Emergency Department (HOSPITAL_COMMUNITY): Payer: Commercial Managed Care - PPO

## 2017-09-16 ENCOUNTER — Emergency Department (HOSPITAL_COMMUNITY)
Admission: EM | Admit: 2017-09-16 | Discharge: 2017-09-16 | Disposition: A | Discharge status: Home / Self Care | Payer: Commercial Managed Care - PPO | Attending: Emergency Medicine | Admitting: Emergency Medicine

## 2017-09-16 DIAGNOSIS — N83209 Unspecified ovarian cyst, unspecified side: Secondary | ICD-10-CM

## 2017-09-16 LAB — CBC
HCT: 33 % — ABNORMAL LOW (ref 36.0–46.0)
Hemoglobin: 11 g/dL — ABNORMAL LOW (ref 12.0–15.0)
MCH: 29.6 pg (ref 26.0–34.0)
MCHC: 33.3 g/dL (ref 30.0–36.0)
MCV: 88.7 fL (ref 78.0–100.0)
PLATELETS: 139 10*3/uL — AB (ref 150–400)
RBC: 3.72 MIL/uL — ABNORMAL LOW (ref 3.87–5.11)
RDW: 13 % (ref 11.5–15.5)
WBC: 10.1 10*3/uL (ref 4.0–10.5)

## 2017-09-16 LAB — WET PREP, GENITAL
CLUE CELLS WET PREP: NONE SEEN
Sperm: NONE SEEN
Trich, Wet Prep: NONE SEEN
WBC WET PREP: NONE SEEN
YEAST WET PREP: NONE SEEN

## 2017-09-16 MED ORDER — HYDROMORPHONE HCL 1 MG/ML IJ SOLN
0.5000 mg | Freq: Once | INTRAMUSCULAR | Status: AC
  Administered 2017-09-16: 0.5 mg via INTRAVENOUS
  Filled 2017-09-16: qty 1

## 2017-09-16 MED ORDER — KETOROLAC TROMETHAMINE 30 MG/ML IJ SOLN
15.0000 mg | Freq: Once | INTRAMUSCULAR | Status: AC
  Administered 2017-09-16: 15 mg via INTRAVENOUS
  Filled 2017-09-16: qty 1

## 2017-09-16 MED ORDER — OXYCODONE-ACETAMINOPHEN 5-325 MG PO TABS
1.0000 | ORAL_TABLET | Freq: Once | ORAL | Status: AC
  Administered 2017-09-16: 1 via ORAL
  Filled 2017-09-16: qty 1

## 2017-09-16 MED ORDER — IOPAMIDOL (ISOVUE-300) INJECTION 61%
INTRAVENOUS | Status: AC
  Administered 2017-09-16: 100 mL
  Filled 2017-09-16: qty 100

## 2017-09-16 MED ORDER — ONDANSETRON HCL 4 MG/2ML IJ SOLN
4.0000 mg | Freq: Once | INTRAMUSCULAR | Status: AC
  Administered 2017-09-16: 4 mg via INTRAVENOUS
  Filled 2017-09-16: qty 2

## 2017-09-16 MED ORDER — OXYCODONE-ACETAMINOPHEN 5-325 MG PO TABS: 1.0000 | ORAL_TABLET | ORAL | 0 refills | Status: DC | PRN

## 2017-09-16 MED ORDER — SODIUM CHLORIDE 0.9 % IV BOLUS (SEPSIS)
1000.0000 mL | Freq: Once | INTRAVENOUS | Status: AC
  Administered 2017-09-16: 1000 mL via INTRAVENOUS

## 2017-09-16 NOTE — ED Notes (Addendum)
ED Provider at bedside for pelvic exam. Mandi, NT present as witness.

## 2017-09-16 NOTE — Discharge Instructions (Signed)
Return to emergency department or [facility] if you have increasing abdominal pain, fever, racing heartbeat, dizziness with standing or passing out.

## 2017-09-16 NOTE — ED Notes (Signed)
Pt departed in NAD, refused use of wheelchair.  

## 2017-09-16 NOTE — ED Notes (Signed)
Nurse drawing labs. 

## 2017-09-16 NOTE — ED Notes (Signed)
ED Provider at bedside. 

## 2017-09-16 NOTE — ED Notes (Signed)
Patient transported to CT 

## 2017-09-16 NOTE — ED Provider Notes (Signed)
[facility] EMERGENCY DEPARTMENT Provider Note   CSN: [id number] Arrival date & time: 09/15/17  2222     History   Chief Complaint Chief Complaint  Patient presents with  . Abdominal Pain    HPI Kelli Willis is a 21 y.o. female.  Patient presents to the emergency department for evaluation of right lower quadrant abdominal pain that started this evening.  Patient reports a severe and persistent pain.  She reports that she has had similar pains in the past with ovarian cysts.  She has had ruptured ovarian cyst but has never required surgery.  Patient has not had any noted fever.  There is no nausea, vomiting, diarrhea or constipation associated with the abdominal pain.  Patient denies vaginal discharge and urinary symptoms.      History reviewed. No pertinent past medical history.  There are no active problems to display for this patient.   History reviewed. No pertinent surgical history.  OB History    No data available       Home Medications    Prior to Admission medications   Medication Sig Start Date End Date Taking? Authorizing Provider  benzonatate (TESSALON) 100 MG capsule Take by mouth 3 (three) times daily as needed for cough.    [provider]  HYDROcodone-acetaminophen (NORCO/VICODIN) 5-325 MG tablet Take 1-2 tablets by mouth every 4 (four) hours as needed. Patient not taking: Reported on 02/02/2017 01/26/17   Muthersbaugh, Dahlia Client, PA-C  magnesium citrate SOLN Take 1 Bottle by mouth once.    [provider]  ondansetron (ZOFRAN ODT) 8 MG disintegrating tablet 8mg  ODT q4 hours prn nausea Patient not taking: Reported on 02/02/2017 01/26/17   Muthersbaugh, Dahlia Client, PA-C  oxyCODONE-acetaminophen (PERCOCET) 5-325 MG tablet Take 1 tablet by mouth every 4 (four) hours as needed for moderate pain. 09/16/17   Gilda Crease, MD  sertraline (ZOLOFT) 100 MG tablet Take 100 mg by mouth daily.    [provider]     Family History History reviewed. No pertinent family history.  Social History Social History   Tobacco Use  . Smoking status: Never Smoker  . Smokeless tobacco: Never Used  Substance Use Topics  . Alcohol use: Yes    Comment: Occ  . Drug use: No     Allergies   Patient has no known allergies.   Review of Systems Review of Systems  Gastrointestinal: Positive for abdominal pain.  All other systems reviewed and are negative.    Physical Exam Updated Vital Signs BP 109/63   Pulse (!) 56   Temp 98.3 F (36.8 C) (Oral)   Resp 16   Ht 5\' 1"  (1.549 m)   Wt 54.4 kg (120 lb)   SpO2 100%   BMI 22.67 kg/m   Physical Exam  Constitutional: She is oriented to person, place, and time. She appears well-developed and well-nourished. No distress.  HENT:  Head: Normocephalic and atraumatic.  Right Ear: Hearing normal.  Left Ear: Hearing normal.  Nose: Nose normal.  Mouth/Throat: Oropharynx is clear and moist and mucous membranes are normal.  Eyes: Conjunctivae and EOM are normal. Pupils are equal, round, and reactive to light.  Neck: Normal range of motion. Neck supple.  Cardiovascular: Regular rhythm, S1 normal and S2 normal. Exam reveals no gallop and no friction rub.  No murmur heard. Pulmonary/Chest: Effort normal and breath sounds normal. No respiratory distress. She exhibits no tenderness.  Abdominal: Soft. Normal appearance and bowel sounds are normal. There is no  hepatosplenomegaly. There is tenderness in the right lower quadrant and left lower quadrant. There is no rebound, no guarding, no tenderness at McBurney's point and negative Murphy's sign. No hernia.  Genitourinary: Vagina normal. There is no rash or tenderness on the right labia. There is no rash or tenderness on the left labia. Right adnexum displays tenderness. Right adnexum displays no mass. Left adnexum displays no mass.  Musculoskeletal: Normal range of motion.  Neurological: She is alert and oriented  to person, place, and time. She has normal strength. No cranial nerve deficit or sensory deficit. Coordination normal. GCS eye subscore is 4. GCS verbal subscore is 5. GCS motor subscore is 6.  Skin: Skin is warm, dry and intact. No rash noted. No cyanosis.  Psychiatric: She has a normal mood and affect. Her speech is normal and behavior is normal. Thought content normal.  Nursing note and vitals reviewed.    ED Treatments / Results  Labs (all labs ordered are listed, but only abnormal results are displayed) Labs Reviewed  COMPREHENSIVE METABOLIC PANEL - Abnormal; Notable for the following components:      Result Value   Glucose, Bld 114 (*)    ALT 11 (*)    All other components within normal limits  CBC - Abnormal; Notable for the following components:   WBC 10.7 (*)    All other components within normal limits  CBC - Abnormal; Notable for the following components:   RBC 3.72 (*)    Hemoglobin 11.0 (*)    HCT 33.0 (*)    Platelets 139 (*)    All other components within normal limits  WET PREP, GENITAL  URINALYSIS, ROUTINE W REFLEX MICROSCOPIC  I-STAT BETA HCG BLOOD, ED ([facility], [facility], [facility] ONLY)  GC/CHLAMYDIA PROBE AMP ([place]) NOT AT [facility]    EKG  EKG Interpretation None       Radiology Ct Abdomen Pelvis W Contrast  Result Date: 09/16/2017 CLINICAL DATA:  Right lower quadrant abdominal pain for 2 days. EXAM: CT ABDOMEN AND PELVIS WITH CONTRAST TECHNIQUE: Multidetector CT imaging of the abdomen and pelvis was performed using the standard protocol following bolus administration of intravenous contrast. CONTRAST:  100mL ISOVUE-300 IOPAMIDOL (ISOVUE-300) INJECTION 61% COMPARISON:  01/26/2017 FINDINGS: Lower chest: The lung bases are clear. Hepatobiliary: No focal liver abnormality is seen. No gallstones, gallbladder wall thickening, or biliary dilatation. Pancreas: Unremarkable. No pancreatic ductal dilatation or surrounding inflammatory changes. Spleen: Normal in size without  focal abnormality. Adrenals/Urinary Tract: Adrenal glands are unremarkable. Kidneys are normal, without renal calculi, focal lesion, or hydronephrosis. Bladder is unremarkable. Stomach/Bowel: Stomach is within normal limits. Appendix appears normal. No evidence of bowel wall thickening, distention, or inflammatory changes. Vascular/Lymphatic: No significant vascular findings are present. No enlarged abdominal or pelvic lymph nodes. Reproductive: Uterus and ovaries are not enlarged. Moderate free fluid in the pelvis with increased attenuation suggesting hemorrhagic fluid. This may result from rupture of a hemorrhagic cyst. Involuting cyst demonstrated in the right ovary. Other: No free air in the abdomen. Abdominal wall musculature appears intact. Musculoskeletal: No acute or significant osseous findings. IMPRESSION: 1. Moderate free fluid in the pelvis with increased attenuation suggesting hemorrhagic fluid. This may result from rupture of a hemorrhagic cyst. 2. Otherwise, no acute process demonstrated in the abdomen or pelvis. Electronically Signed   By: Burman NievesWilliam  Stevens M.D.   On: 09/16/2017 02:28    Procedures Procedures (including critical care time)  Medications Ordered in ED Medications  fentaNYL (SUBLIMAZE) injection 50 mcg (50 mcg  Nasal Given 09/15/17 2244)  HYDROmorphone (DILAUDID) injection 0.5 mg (0.5 mg Intravenous Given 09/16/17 0153)  ondansetron (ZOFRAN) injection 4 mg (4 mg Intravenous Given 09/16/17 0148)  sodium chloride 0.9 % bolus 1,000 mL (0 mLs Intravenous Stopped 09/16/17 0318)  iopamidol (ISOVUE-300) 61 % injection (100 mLs  Contrast Given 09/16/17 0202)  HYDROmorphone (DILAUDID) injection 0.5 mg (0.5 mg Intravenous Given 09/16/17 0315)  ketorolac (TORADOL) 30 MG/ML injection 15 mg (15 mg Intravenous Given 09/16/17 0436)  oxyCODONE-acetaminophen (PERCOCET/ROXICET) 5-325 MG per tablet 1 tablet (1 tablet Oral Given 09/16/17 0432)     Initial Impression / Assessment and Plan /  ED Course  I have reviewed the triage vital signs and the nursing notes.  Pertinent labs & imaging results that were available during my care of the patient were reviewed by me and considered in my medical decision making (see chart for details).     Patient presents to the ER for evaluation of right-sided abdominal and pelvic pain.  She has had similar pains in the past with hemorrhagic and ruptured ovarian cyst.  Patient having diffuse lower abdominal tenderness but right side is worse than the left.  Patient is not hypotensive.  Hemoglobin is normal.  Blood work unremarkable.  The patient underwent CT scan for further evaluation.  No evidence of appendicitis, does have evidence of ruptured hemorrhagic cyst.  Repeat CBC after liter fluid revealed very slight drop in hemoglobin.  She continues to do well, no tachycardia, no hypotension.  Patient's pain is much improved.  Patient given extensive return precautions, will discharge with analgesia.  Final Clinical Impressions(s) / ED Diagnoses   Final diagnoses:  Hemorrhagic ovarian cyst    ED Discharge Orders        Ordered    oxyCODONE-acetaminophen (PERCOCET) 5-325 MG tablet  Every 4 hours PRN     09/16/17 0513       Gilda CreasePollina, Ernesto Lashway J, MD 09/16/17 [phone]

## 2017-09-18 LAB — GC/CHLAMYDIA PROBE AMP (~~LOC~~) NOT AT ARMC
CHLAMYDIA, DNA PROBE: NEGATIVE
NEISSERIA GONORRHEA: NEGATIVE

## 2018-03-14 ENCOUNTER — Encounter: Payer: Self-pay | Admitting: Obstetrics and Gynecology

## 2018-03-14 ENCOUNTER — Telehealth: Payer: Self-pay | Admitting: Obstetrics and Gynecology

## 2018-03-14 ENCOUNTER — Other Ambulatory Visit: Payer: Self-pay

## 2018-03-14 ENCOUNTER — Ambulatory Visit: Payer: Commercial Managed Care - PPO | Admitting: Obstetrics and Gynecology

## 2018-03-14 VITALS — BP 98/56 | HR 60 | Resp 14 | Ht 61.25 in | Wt 112.0 lb

## 2018-03-14 DIAGNOSIS — Z01419 Encounter for gynecological examination (general) (routine) without abnormal findings: Secondary | ICD-10-CM

## 2018-03-14 DIAGNOSIS — Z0000 Encounter for general adult medical examination without abnormal findings: Secondary | ICD-10-CM

## 2018-03-14 DIAGNOSIS — R102 Pelvic and perineal pain: Secondary | ICD-10-CM

## 2018-03-14 DIAGNOSIS — Z113 Encounter for screening for infections with a predominantly sexual mode of transmission: Secondary | ICD-10-CM

## 2018-03-14 DIAGNOSIS — R109 Unspecified abdominal pain: Secondary | ICD-10-CM

## 2018-03-14 MED ORDER — IBUPROFEN 800 MG PO TABS: 800.0000 mg | ORAL_TABLET | Freq: Three times a day (TID) | ORAL | 1 refills | Status: AC | PRN

## 2018-03-14 NOTE — Telephone Encounter (Signed)
Spoke with patient regarding ultrasound appointment scheduled for 03/15/18. Patient advised she was not able to get her "work shift" covered. Patient requested to reschedule. Patient is scheduled 03/20/18 with Dr Jertson. Patient understands we will call her once services have been pre-certified. Will close encounter °  °Forwarding to Dr Jertson for final review ° °

## 2018-03-14 NOTE — Addendum Note (Signed)
Addended by: Tobi Bastos on: 03/14/2018 11:54 AM   Modules accepted: Orders

## 2018-03-14 NOTE — Progress Notes (Signed)
22 y.o. G1P0010 SingleCaucasianF here for annual exam.   She has a nexplanon, placed about 3 years ago. She has irregular bleeding, can go a few months without bleeding. Can bleed after intercourse.  Period Duration (Days): 4 days  Period Pattern: (!) Irregular Menstrual Flow: Light Menstrual Control: Thin pad, Tampon Menstrual Control Change Freq (Hours): changes pad twice daily  Dysmenorrhea: (!) Moderate Dysmenorrhea Symptoms: Cramping  Prior to contraception cycles were monthly, very heavy, horrible cramps.  She has had issues with painful ovarian cysts. Had to go to the ER 2 x in just over the last year.  She had intercourse on Sunday, it was uncomfortable towards the end, ever since then she feels bloating and discomfort in her lower abdomen. It is a constant level 4/10 pain in her lower abdomen/pelvis, intermittent sharp pains up to a 6/10 in severity. No fevers, nausea, emesis, diarrhea, constipation or urinary c/o.   She has a new partner in the last month. Intermittently using condoms. She c/o intermittent deep dyspareunia. Occurs more often than not. Started in the last year.   No LMP recorded (lmp unknown). Patient has had an implant.          Sexually active: Yes.    The current method of family planning is Nexplanon.    Exercising: Yes.    cardio/running  Smoker:  no  Health Maintenance: Pap:  02-02-17 WNL  History of abnormal Pap:  no TDaP:  Up to date Gardasil: completed all 3    reports that she has never smoked. She has never used smokeless tobacco. She reports that she drinks about 3.0 oz of alcohol per week. She reports that she has current or past drug history. Drug: Marijuana. 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   Past Medical History:  Diagnosis Date  . Depression   . Dysmenorrhea   . History of ovarian cyst   . Ruptured ovarian cyst 2018    History reviewed. No pertinent surgical history.  Current Outpatient Medications   Medication Sig Dispense Refill  . etonogestrel (NEXPLANON) 68 MG IMPL implant 1 each by Subdermal route once.     No current facility-administered medications for this visit.     History reviewed. No pertinent family history.  Review of Systems  Constitutional: Negative.   HENT: Negative.   Eyes: Negative.   Respiratory: Negative.   Cardiovascular: Negative.   Gastrointestinal: Negative.   Endocrine: Negative.   Genitourinary: Positive for dyspareunia.       Dysmenorrhea   Musculoskeletal: Negative.   Skin: Negative.   Allergic/Immunologic: Negative.   Neurological: Negative.   Psychiatric/Behavioral: Negative.     Exam:   BP (!) 98/56 (BP Location: Right Arm, Patient Position: Sitting, Cuff Size: Normal)   Pulse 60   Resp 14   Ht 5' 1.25" (1.556 m)   Wt 112 lb (50.8 kg)   LMP  (LMP Unknown)   BMI 20.99 kg/m   Weight change: @ Height:   Height: 5' 1.25" (155.6 cm)  Ht Readings from Last 3 Encounters:  03/14/18 5' 1.25" (1.556 m)  09/15/17  (1.549 m)  02/02/17  (1.549 m)    General appearance: alert, cooperative and appears stated age Head: Normocephalic, without obvious abnormality, atraumatic Neck: no adenopathy, supple, symmetrical, trachea midline and thyroid normal to inspection and palpation Lungs: clear to auscultation bilaterally Cardiovascular: regular rate and rhythm Breasts: normal appearance, no masses or tenderness Abdomen: soft, mild diffuse tenderness,  worst in the LLQ, no rebound, no guarding; non distended,  no masses,  no organomegaly Extremities: extremities normal, atraumatic, no cyanosis or edema Skin: Skin color, texture, turgor normal. No rashes or lesions Lymph nodes: Cervical, supraclavicular, and axillary nodes normal. No abnormal inguinal nodes palpated Neurologic: Grossly normal   Pelvic: External genitalia:  no lesions              Urethra:  normal appearing urethra with no masses, tenderness or lesions               Bartholins and Skenes: normal                 Vagina: normal appearing vagina with normal color and discharge, no lesions              Cervix: no cervical motion tenderness and no lesions               Bimanual Exam:  Uterus:  mildly tender, not enlarged              Adnexa: bilaterally tender, slight fullness on the left               Rectovaginal: Confirms               Anus:  normal sphincter tone, no lesions  Chaperone was present for exam.  A:  Well Woman with normal exam  Contraception, nexplanon due for removal  H/O recurrent ovarian cysts, has been in the ER x 2  Abdominal pelvic pain, suspect ovarian cyst  P:   Discussed options for contraception, including: Depo-provera, OCP's, nuraring, nexplanon, mirena or kyleena  We discussed the risk of ovarian cysts with progesterone only contraception.  She isn't good about remembering pills  Information given on the kyleena and mirena IUD's  Desires full STD testing, including HSV  Screening labs  Discussed breast self exam  Discussed calcium and vit D intake  Return for GYN ultrasound  Ibuprofen for pain

## 2018-03-14 NOTE — Patient Instructions (Signed)
EXERCISE AND DIET:  We recommended that you start or continue a regular exercise program for good health. Regular exercise means any activity that makes your heart beat faster and makes you sweat.  We recommend exercising at least 30 minutes per day at least 3 days a week, preferably 4 or 5.  We also recommend a diet low in fat and sugar.  Inactivity, poor dietary choices and obesity can cause diabetes, heart attack, stroke, and kidney damage, among others.    ALCOHOL AND SMOKING:  Women should limit their alcohol intake to no more than 7 drinks/beers/glasses of wine (combined, not each!) per week. Moderation of alcohol intake to this level decreases your risk of breast cancer and liver damage. And of course, no recreational drugs are part of a healthy lifestyle.  And absolutely no smoking or even second hand smoke. Most people know smoking can cause heart and lung diseases, but did you know it also contributes to weakening of your bones? Aging of your skin?  Yellowing of your teeth and nails?  CALCIUM AND VITAMIN D:  Adequate intake of calcium and Vitamin D are recommended.  The recommendations for exact amounts of these supplements seem to change often, but generally speaking 600 mg of calcium (either carbonate or citrate) and 800 units of Vitamin D per day seems prudent. Certain women may benefit from higher intake of Vitamin D.  If you are among these women, your doctor will have told you during your visit.    PAP SMEARS:  Pap smears, to check for cervical cancer or precancers,  have traditionally been done yearly, although recent scientific advances have shown that most women can have pap smears less often.  However, every woman still should have a physical exam from her gynecologist every year. It will include a breast check, inspection of the vulva and vagina to check for abnormal growths or skin changes, a visual exam of the cervix, and then an exam to evaluate the size and shape of the uterus and  ovaries.  And after 23 years of age, a rectal exam is indicated to check for rectal cancers. We will also provide age appropriate advice regarding health maintenance, like when you should have certain vaccines, screening for sexually transmitted diseases, bone density testing, colonoscopy, mammograms, etc.   MAMMOGRAMS:  All women over 40 years old should have a yearly mammogram. Many facilities now offer a "3D" mammogram, which may cost around $50 extra out of pocket. If possible,  we recommend you accept the option to have the 3D mammogram performed.  It both reduces the number of women who will be called back for extra views which then turn out to be normal, and it is better than the routine mammogram at detecting truly abnormal areas.    COLONOSCOPY:  Colonoscopy to screen for colon cancer is recommended for all women at age 50.  We know, you hate the idea of the prep.  We agree, BUT, having colon cancer and not knowing it is worse!!  Colon cancer so often starts as a polyp that can be seen and removed at colonscopy, which can quite literally save your life!  And if your first colonoscopy is normal and you have no family history of colon cancer, most women don't have to have it again for 10 years.  Once every ten years, you can do something that may end up saving your life, right?  We will be happy to help you get it scheduled when you are ready.    Be sure to check your insurance coverage so you understand how much it will cost.  It may be covered as a preventative service at no cost, but you should check your particular policy.      Breast Self-Awareness Breast self-awareness means being familiar with how your breasts look and feel. It involves checking your breasts regularly and reporting any changes to your health care provider. Practicing breast self-awareness is important. A change in your breasts can be a sign of a serious medical problem. Being familiar with how your breasts look and feel allows  you to find any problems early, when treatment is more likely to be successful. All women should practice breast self-awareness, including women who have had breast implants. How to do a breast self-exam One way to learn what is normal for your breasts and whether your breasts are changing is to do a breast self-exam. To do a breast self-exam: Look for Changes  1. Remove all the clothing above your waist. 2. Stand in front of a mirror in a room with good lighting. 3. Put your hands on your hips. 4. Push your hands firmly downward. 5. Compare your breasts in the mirror. Look for differences between them (asymmetry), such as: ? Differences in shape. ? Differences in size. ? Puckers, dips, and bumps in one breast and not the other. 6. Look at each breast for changes in your skin, such as: ? Redness. ? Scaly areas. 7. Look for changes in your nipples, such as: ? Discharge. ? Bleeding. ? Dimpling. ? Redness. ? A change in position. Feel for Changes  Carefully feel your breasts for lumps and changes. It is best to do this while lying on your back on the floor and again while sitting or standing in the shower or tub with soapy water on your skin. Feel each breast in the following way:  Place the arm on the side of the breast you are examining above your head.  Feel your breast with the other hand.  Start in the nipple area and make  inch (2 cm) overlapping circles to feel your breast. Use the pads of your three middle fingers to do this. Apply light pressure, then medium pressure, then firm pressure. The light pressure will allow you to feel the tissue closest to the skin. The medium pressure will allow you to feel the tissue that is a little deeper. The firm pressure will allow you to feel the tissue close to the ribs.  Continue the overlapping circles, moving downward over the breast until you feel your ribs below your breast.  Move one finger-width toward the center of the body.  Continue to use the  inch (2 cm) overlapping circles to feel your breast as you move slowly up toward your collarbone.  Continue the up and down exam using all three pressures until you reach your armpit.  Write Down What You Find  Write down what is normal for each breast and any changes that you find. Keep a written record with breast changes or normal findings for each breast. By writing this information down, you do not need to depend only on memory for size, tenderness, or location. Write down where you are in your menstrual cycle, if you are still menstruating. If you are having trouble noticing differences in your breasts, do not get discouraged. With time you will become more familiar with the variations in your breasts and more comfortable with the exam. How often should I examine my breasts? Examine   your breasts every month. If you are breastfeeding, the best time to examine your breasts is after a feeding or after using a breast pump. If you menstruate, the best time to examine your breasts is 5-7 days after your period is over. During your period, your breasts are lumpier, and it may be more difficult to notice changes. When should I see my health care provider? See your health care provider if you notice:  A change in shape or size of your breasts or nipples.  A change in the skin of your breast or nipples, such as a reddened or scaly area.  Unusual discharge from your nipples.  A lump or thick area that was not there before.  Pain in your breasts.  Anything that concerns you.  This information is not intended to replace advice given to you by your health care provider. Make sure you discuss any questions you have with your health care provider. Document Released: 10/10/2005 Document Revised: 03/17/2016 Document Reviewed: 08/30/2015 Else[employer] Interactive Patient Education  2018 [employer]  

## 2018-03-14 NOTE — Telephone Encounter (Signed)
Spoke with patient regarding ultrasound appointment scheduled for 03/15/18. Patient advised she was not able to get her "work shift" covered. Patient requested to reschedule. Patient is scheduled 03/20/18 with Dr Oscar La. Patient understands we will call her once services have been pre-certified. Will close encounter  Forwarding to Dr Oscar La for final review

## 2018-03-15 ENCOUNTER — Other Ambulatory Visit: Payer: Commercial Managed Care - PPO

## 2018-03-15 ENCOUNTER — Other Ambulatory Visit: Payer: Commercial Managed Care - PPO | Admitting: Obstetrics and Gynecology

## 2018-03-15 ENCOUNTER — Telehealth: Payer: Self-pay | Admitting: *Deleted

## 2018-03-15 LAB — LIPID PANEL
CHOLESTEROL TOTAL: 180 mg/dL (ref 100–199)
Chol/HDL Ratio: 3.3 ratio (ref 0.0–4.4)
HDL: 55 mg/dL (ref >39)
LDL CALC: 95 mg/dL (ref 0–99)
TRIGLYCERIDES: 151 mg/dL — AB (ref 0–149)
VLDL CHOLESTEROL CAL: 30 mg/dL (ref 5–40)

## 2018-03-15 LAB — COMPREHENSIVE METABOLIC PANEL
ALT: 9 IU/L (ref 0–32)
AST: 13 IU/L (ref 0–40)
Albumin/Globulin Ratio: 2.3 — ABNORMAL HIGH (ref 1.2–2.2)
Albumin: 5.1 g/dL (ref 3.5–5.5)
Alkaline Phosphatase: 56 IU/L (ref 39–117)
BUN/Creatinine Ratio: 18 (ref 9–23)
BUN: 12 mg/dL (ref 6–20)
Bilirubin Total: 0.4 mg/dL (ref 0.0–1.2)
CALCIUM: 9.6 mg/dL (ref 8.7–10.2)
CO2: 23 mmol/L (ref 20–29)
CREATININE: 0.67 mg/dL (ref 0.57–1.00)
Chloride: 102 mmol/L (ref 96–106)
GFR calc Af Amer: 144 mL/min/{1.73_m2} (ref >59)
GFR, EST NON AFRICAN AMERICAN: 125 mL/min/{1.73_m2} (ref >59)
GLOBULIN, TOTAL: 2.2 g/dL (ref 1.5–4.5)
GLUCOSE: 73 mg/dL (ref 65–99)
Potassium: 4.1 mmol/L (ref 3.5–5.2)
SODIUM: 141 mmol/L (ref 134–144)
Total Protein: 7.3 g/dL (ref 6.0–8.5)

## 2018-03-15 LAB — CHLAMYDIA/GONOCOCCUS/TRICHOMONAS, NAA
CHLAMYDIA BY NAA: NEGATIVE
GONOCOCCUS BY NAA: NEGATIVE
TRICH VAG BY NAA: NEGATIVE

## 2018-03-15 LAB — HEP, RPR, HIV PANEL
HIV Screen 4th Generation wRfx: NONREACTIVE
Hepatitis B Surface Ag: NEGATIVE
RPR Ser Ql: NONREACTIVE

## 2018-03-15 LAB — HSV(HERPES SIMPLEX VRS) I + II AB-IGG: HSV 1 Glycoprotein G Ab, IgG: 52.7 index — ABNORMAL HIGH (ref 0.00–0.90)

## 2018-03-15 LAB — CBC
HEMATOCRIT: 40.2 % (ref 34.0–46.6)
Hemoglobin: 13.1 g/dL (ref 11.1–15.9)
MCH: 29.6 pg (ref 26.6–33.0)
MCHC: 32.6 g/dL (ref 31.5–35.7)
MCV: 91 fL (ref 79–97)
Platelets: 187 10*3/uL (ref 150–450)
RBC: 4.42 x10E6/uL (ref 3.77–5.28)
RDW: 13.9 % (ref 12.3–15.4)
WBC: 6.6 10*3/uL (ref 3.4–10.8)

## 2018-03-15 LAB — HEPATITIS C ANTIBODY: Hep C Virus Ab: <0.1 s/co ratio (ref 0.0–0.9)

## 2018-03-15 NOTE — Telephone Encounter (Signed)
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 If she has a h/o cold sores, that would explain it.  Her triglycerides were just over the normal range. I would just check a fasting lipid panel next year.  The rest of her lab work was normal.

## 2018-03-16 NOTE — Telephone Encounter (Signed)
Patient returning call to Elaine. °

## 2018-03-20 ENCOUNTER — Other Ambulatory Visit: Payer: Commercial Managed Care - PPO | Admitting: Obstetrics and Gynecology

## 2018-03-20 ENCOUNTER — Other Ambulatory Visit: Payer: Commercial Managed Care - PPO

## 2018-03-20 NOTE — Telephone Encounter (Signed)
Please don't charge her for the canceled ultrasound visit. Her pain resolved.

## 2018-03-20 NOTE — Telephone Encounter (Signed)
Spoke with patient. Advised of results as seen below per Dr. Oscar La. Questions answered regarding HSV, patient has additional questions/concerns. Recommended OV for further discussion with provider. Patient is out of town, will return 6/6. OV scheduled for 03/30/18 at 4pm.   Patient request to cancel PUS scheduled for today, stating abd/pelvic pain has resolved.  PUS cancelled, advised Dr. Oscar La will review, I will return call if any additional recommendations provided.  Routing to provider for final review. Patient is agreeable to disposition. Will close encounter.

## 2018-03-20 NOTE — Telephone Encounter (Signed)
Patient called regarding ultrasound appointment (see previous phone encounter).   Also returning call regarding results. Routing to triage.

## 2018-03-20 NOTE — Telephone Encounter (Signed)
Patient called in today and canceled ultrasound appointment for today due to work and declines to reschedule as she is no longer having symptoms.   See additional phone message as patient is requesting results.    Routing to provider for final review. Patient agreeable to disposition. Will close encounter.

## 2018-03-27 ENCOUNTER — Telehealth: Payer: Self-pay | Admitting: Obstetrics and Gynecology

## 2018-03-27 NOTE — Telephone Encounter (Signed)
Patient called and states she does not want Nexplanon removed as it was placed 02/19/2016 and is still valid. She wishes to cancel her appointment. Appointment canceled fjertor 03/30/18 with Dr. Oscar LaJertson.   Routing to provider for final review. Patient agreeable to disposition. Will close encounter.

## 2018-03-30 ENCOUNTER — Ambulatory Visit: Payer: Self-pay | Admitting: Obstetrics and Gynecology

## 2018-05-18 ENCOUNTER — Telehealth: Payer: Self-pay | Admitting: Obstetrics and Gynecology

## 2018-05-18 NOTE — Telephone Encounter (Signed)
Left message to call Marland Reine at [phone].  

## 2018-05-18 NOTE — Telephone Encounter (Signed)
Patient would like to be seen today for std testing.

## 2018-05-21 NOTE — Telephone Encounter (Signed)
Left message to call Tryston Gilliam at [phone].  

## 2018-05-22 NOTE — Telephone Encounter (Signed)
Dr.Silva, this is a Dr.Jertson patient. She has not returned calls to the office x 2 regarding scheduling appointment. Okay to close encounter?

## 2018-05-22 NOTE — Telephone Encounter (Signed)
I have closed the encounter.
